# Patient Record
Sex: Male | Born: 1945 | Race: White | Hispanic: No | Marital: Single | Smoking: Former smoker
Health system: Southern US, Community
[De-identification: ages and names within clinical notes are randomized; demographics above are authoritative.]

## PROBLEM LIST (undated history)

## (undated) DIAGNOSIS — I10 Essential (primary) hypertension: Secondary | ICD-10-CM

## (undated) DIAGNOSIS — I259 Chronic ischemic heart disease, unspecified: Secondary | ICD-10-CM

## (undated) DIAGNOSIS — E1165 Type 2 diabetes mellitus with hyperglycemia: Secondary | ICD-10-CM

## (undated) DIAGNOSIS — E7800 Pure hypercholesterolemia, unspecified: Secondary | ICD-10-CM

## (undated) DIAGNOSIS — K759 Inflammatory liver disease, unspecified: Secondary | ICD-10-CM

## (undated) HISTORY — DX: Pure hypercholesterolemia, unspecified: E78.00

## (undated) HISTORY — DX: Type 2 diabetes mellitus with hyperglycemia: E11.65

## (undated) HISTORY — DX: Essential (primary) hypertension: I10

## (undated) HISTORY — DX: Chronic ischemic heart disease, unspecified: I25.9

## (undated) HISTORY — DX: Inflammatory liver disease, unspecified: K75.9

---

## 2006-07-13 ENCOUNTER — Emergency Department: Payer: Self-pay | Admitting: Emergency Medicine

## 2007-11-21 HISTORY — PX: LEG SURGERY: SHX1003

## 2008-10-02 ENCOUNTER — Inpatient Hospital Stay: Payer: Self-pay | Admitting: Orthopedic Surgery

## 2008-10-19 ENCOUNTER — Ambulatory Visit: Payer: Self-pay | Admitting: Orthopedic Surgery

## 2010-10-07 IMAGING — CR DG CHEST 1V
1 series · 2 of 2 positions shown · non-contrast
Comparison: none

REASON FOR EXAM: fall
COMMENTS:

PROCEDURE:     DXR - DXR CHEST 1 VIEWAP OR PA  - October 02, 2008 [DATE]
RESULT:     The lung fields are clear. The heart, mediastinal and osseous
structures reveal no significant abnormalities.

[Series 1: view not recorded · 0.17mm/px · 2 of 2 slices shown]
[im 1/2]
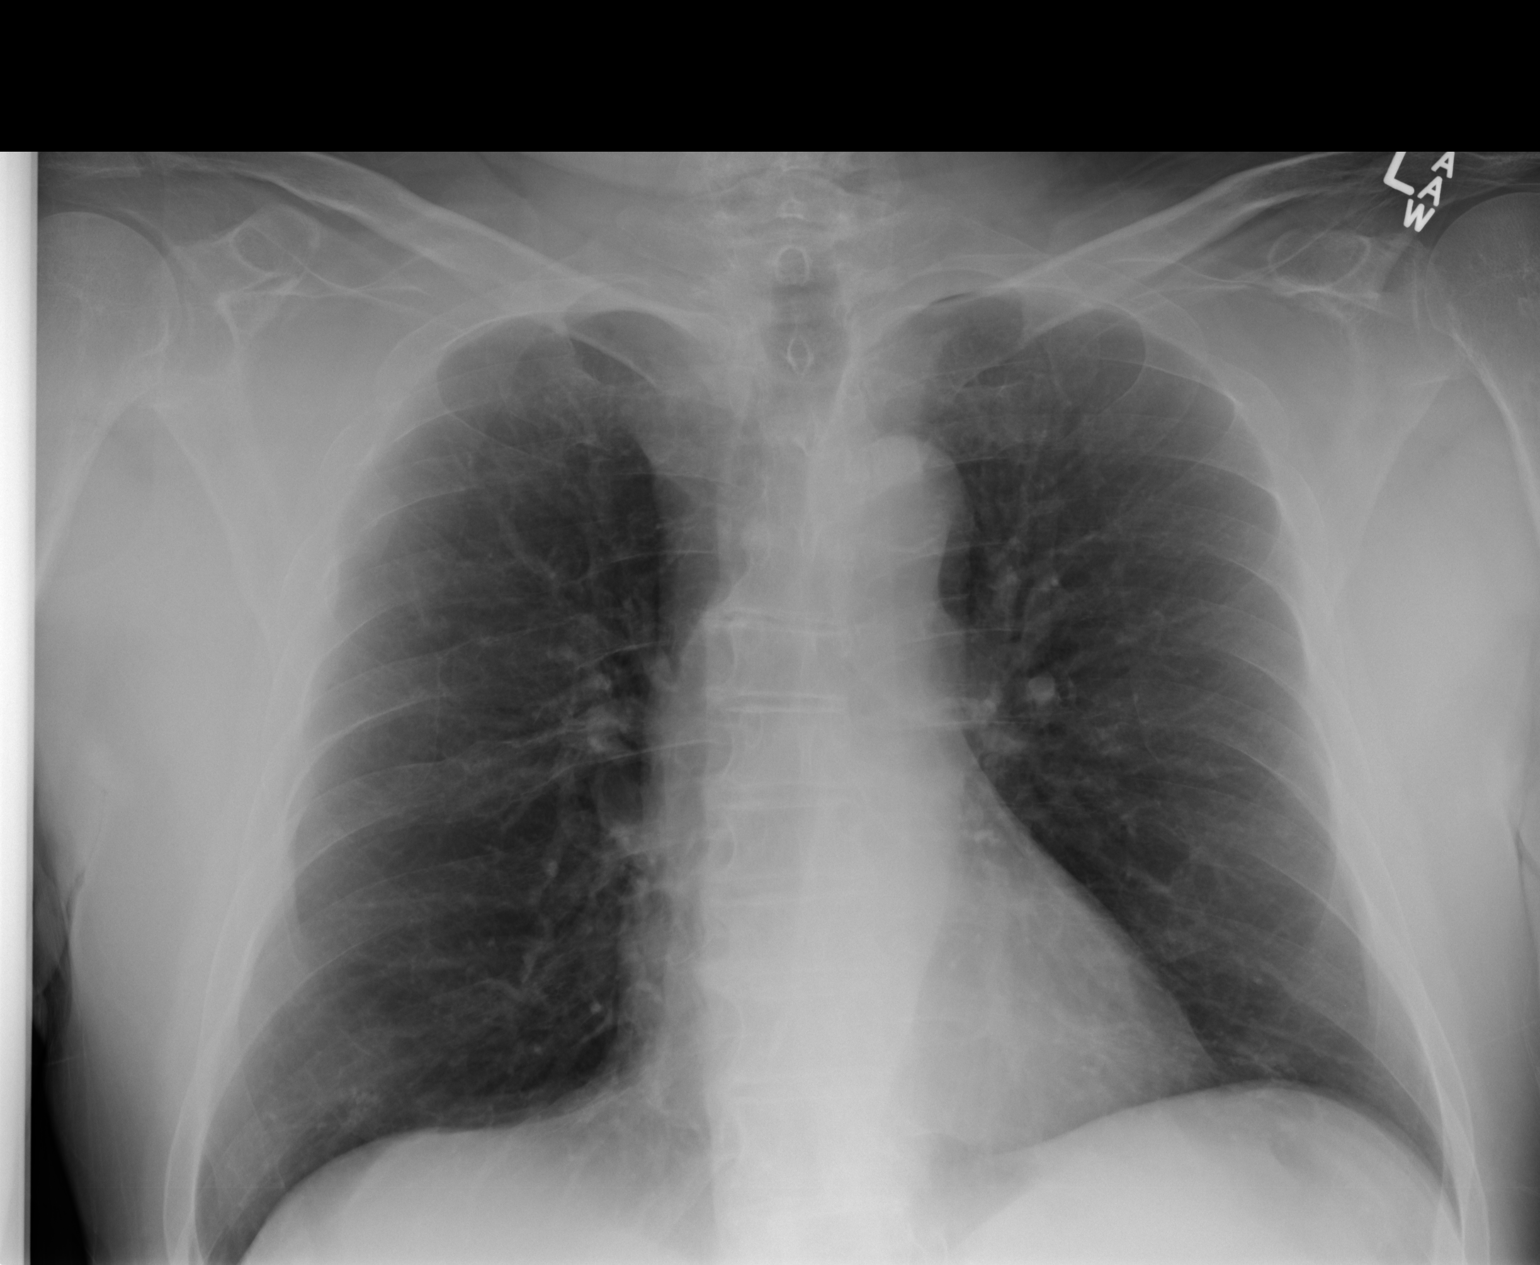
[im 2/2]
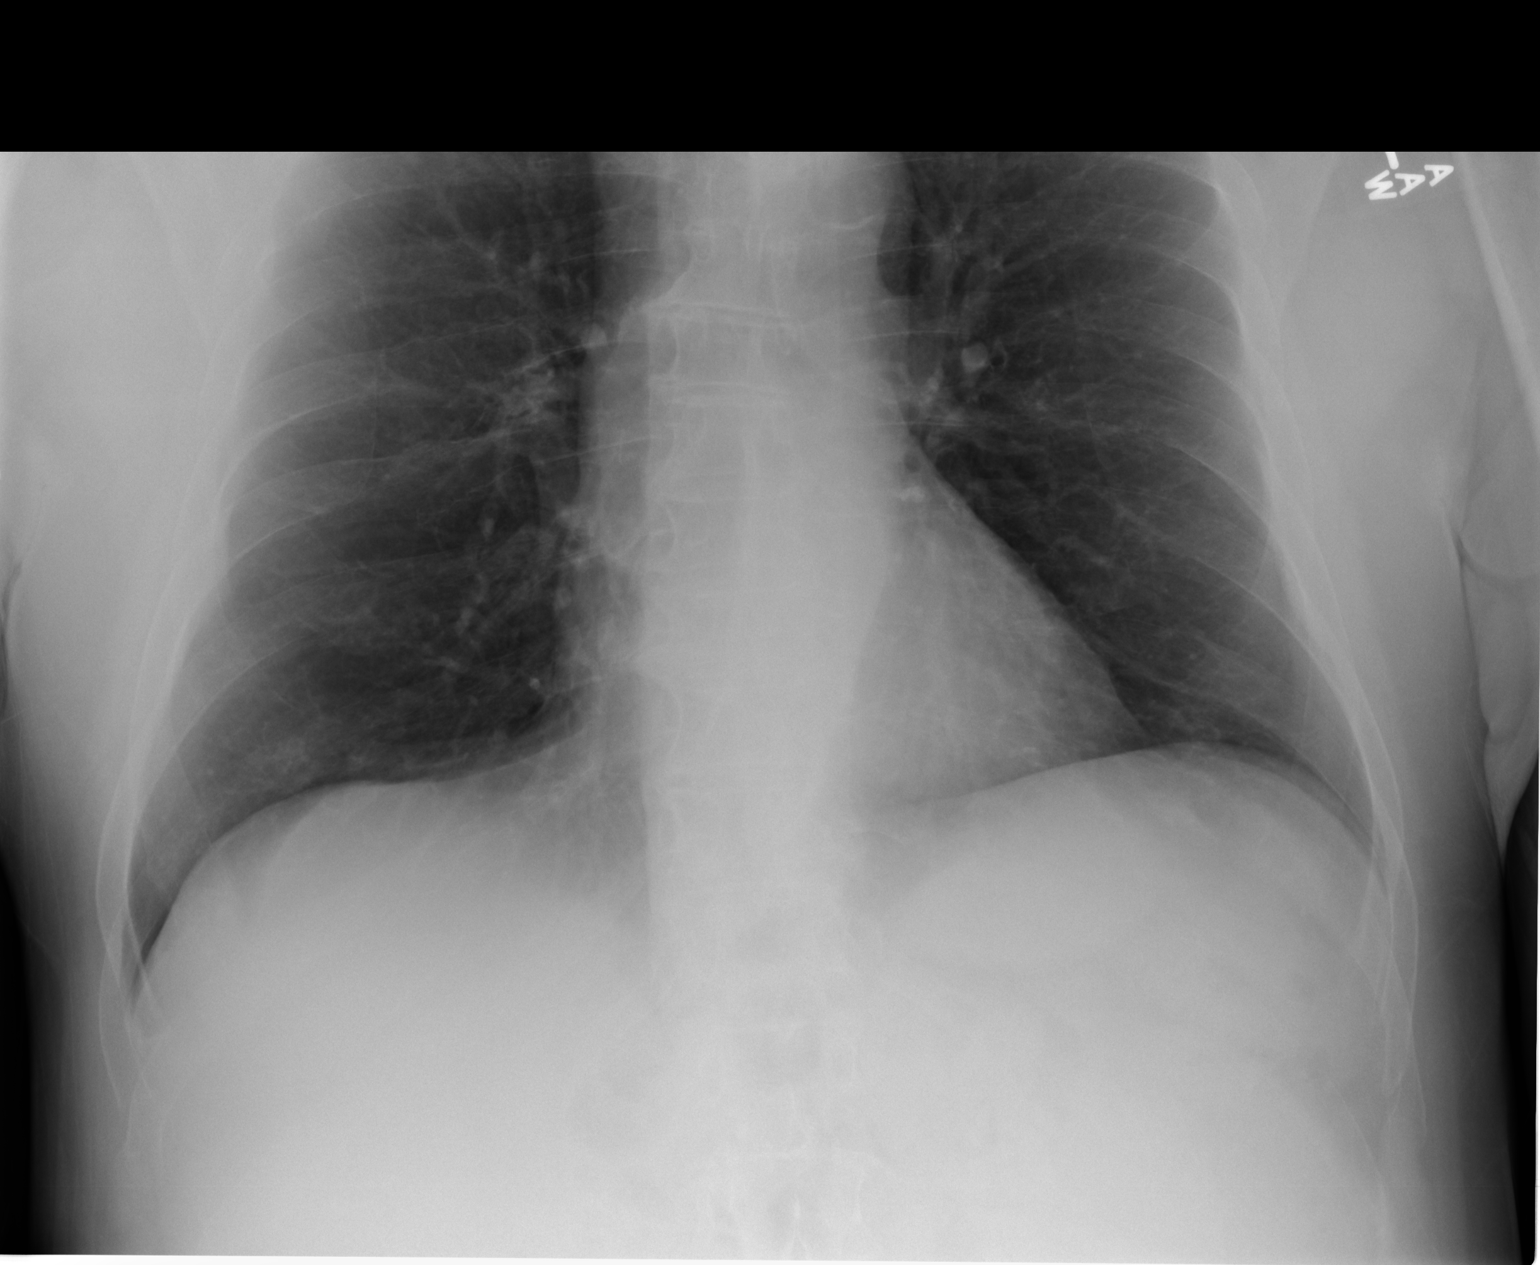

[2 of 2 positions shown; findings below may reference images not displayed]

IMPRESSION: No significant abnormalities are noted.

## 2010-10-24 IMAGING — US US EXTREM LOW VENOUS*L*
1 series · 17 of 20 positions shown · non-contrast
Comparison: none

REASON FOR EXAM: left leg swelling and tenderness  CALL report   [id number]
COMMENTS:

[Series 1: us extrem low venous*left* · 17 of 20 slices shown]
[im 1/20]
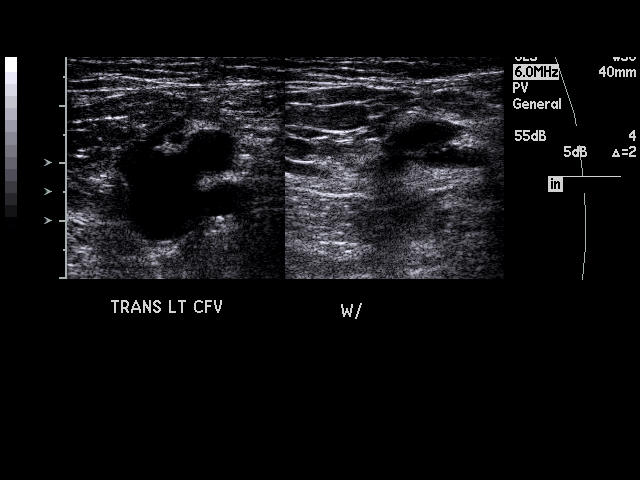
[im 2/20]
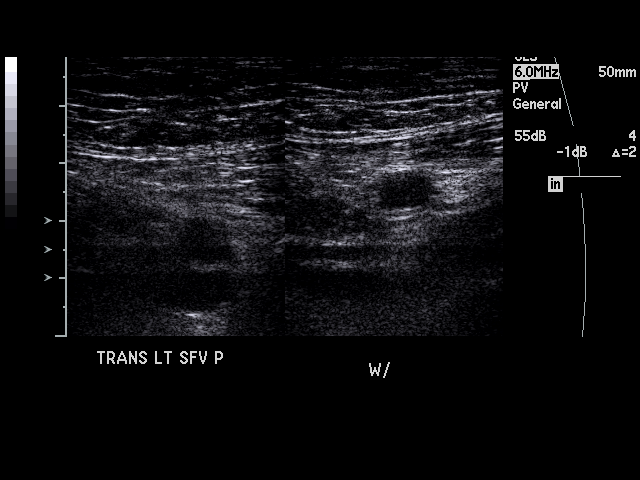
[im 3/20]
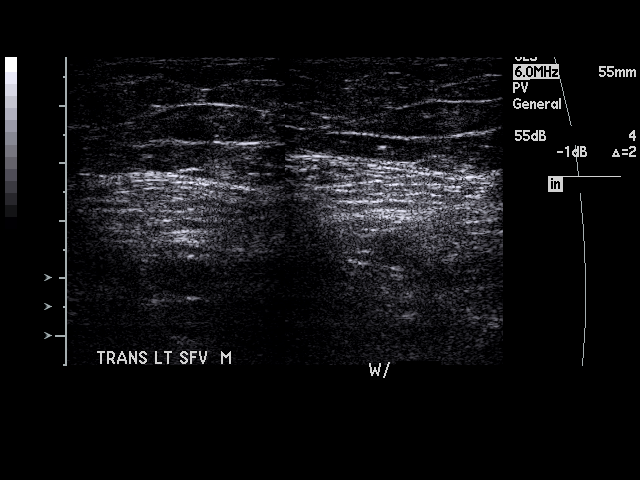
[im 5/20]
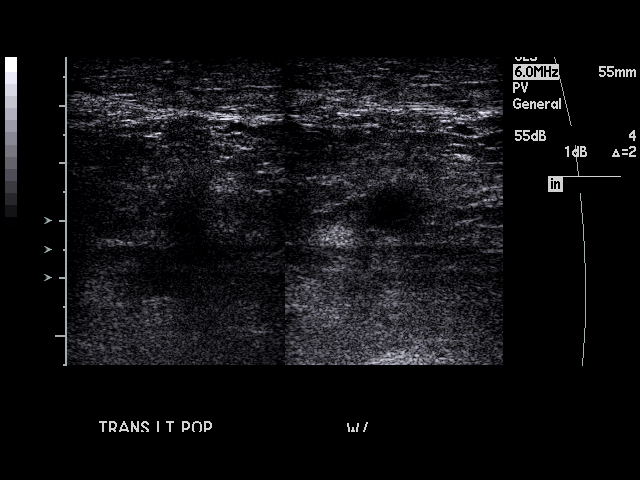
[im 6/20]
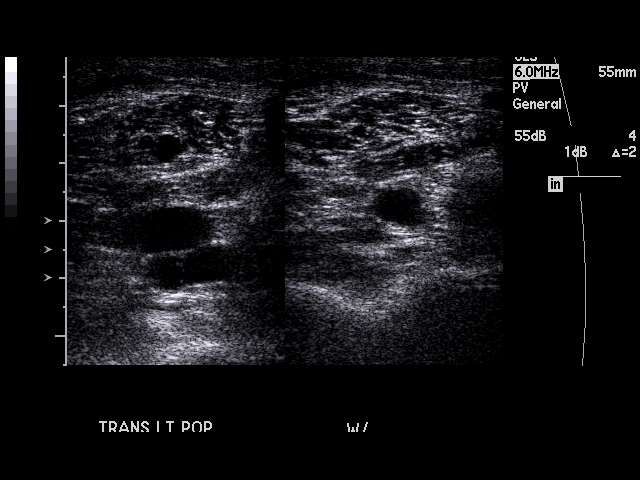
[im 7/20]
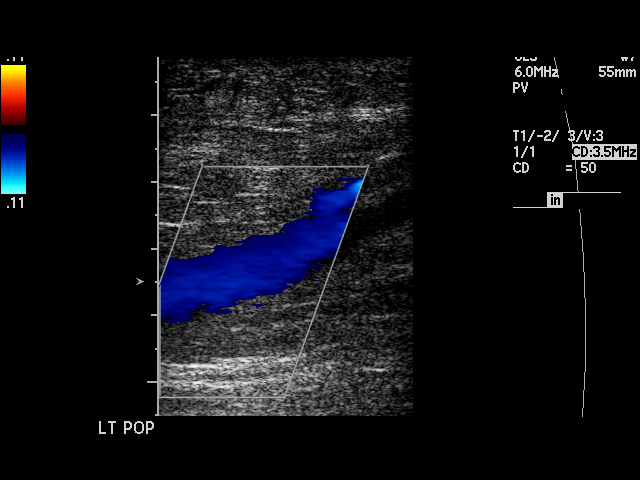
[im 8/20]
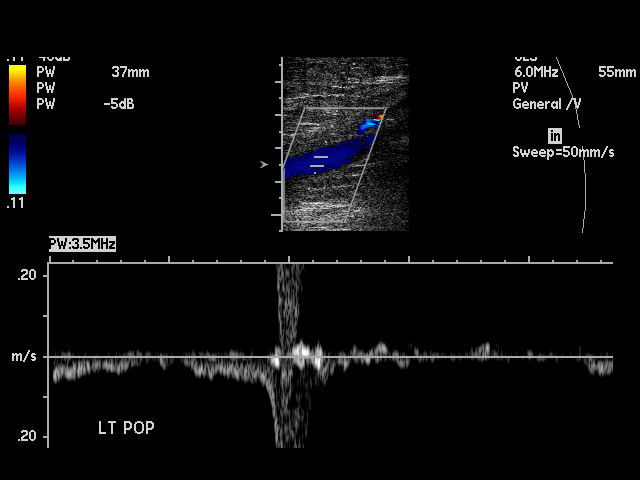
[im 9/20]
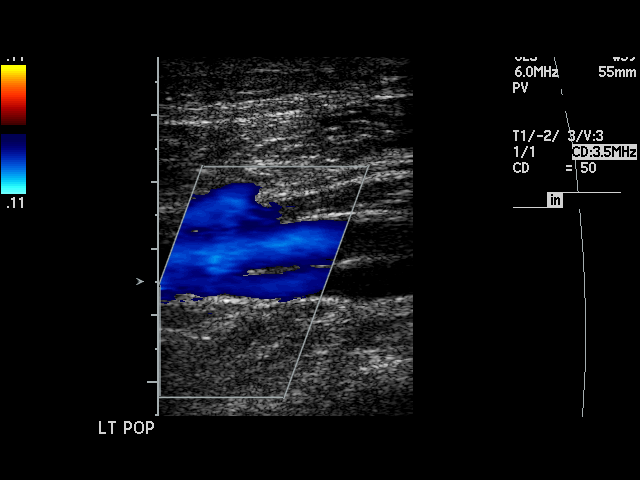
[im 11/20]
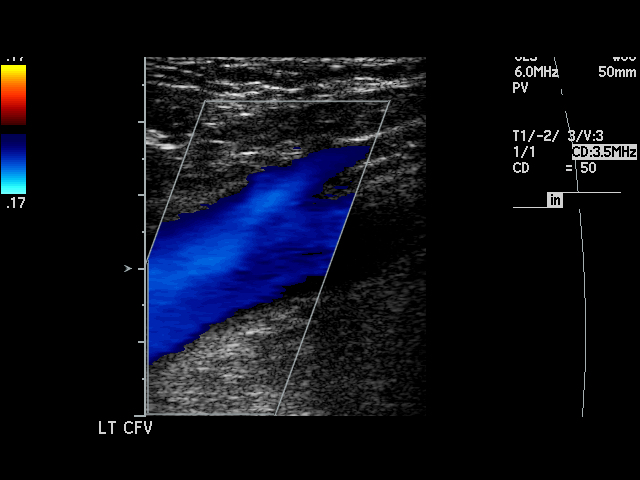
[im 12/20]
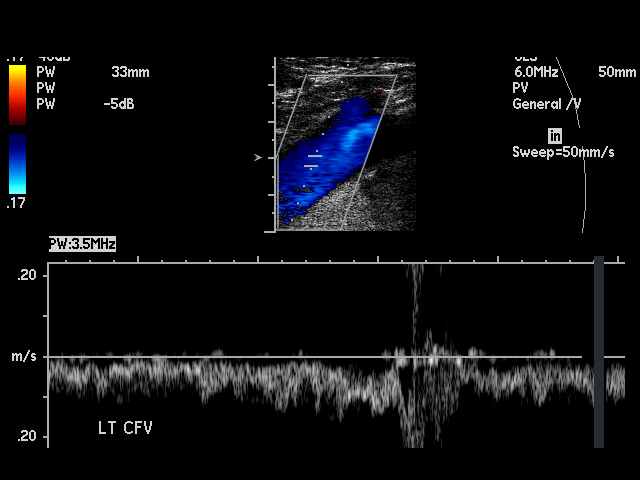
[im 13/20]
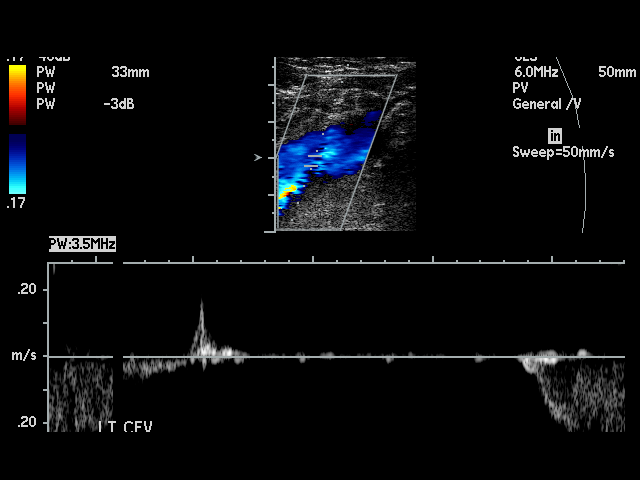
[im 14/20]
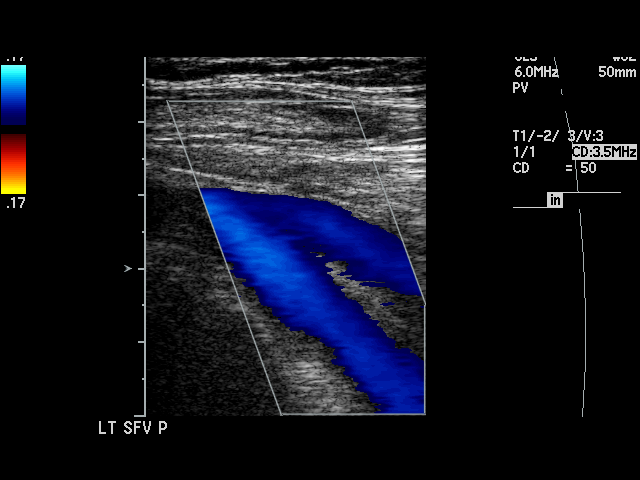
[im 15/20]
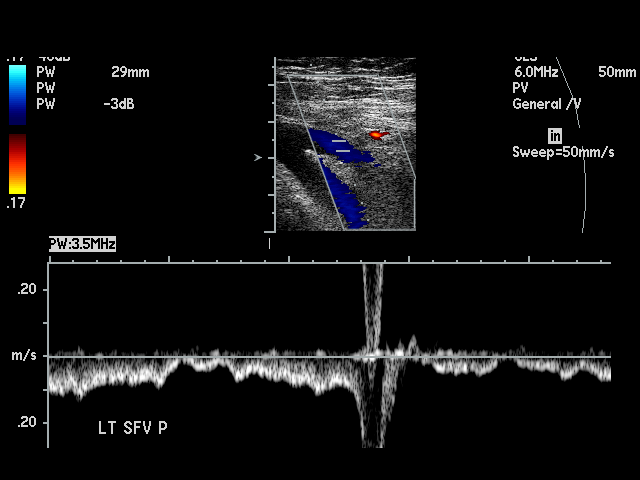
[im 16/20]
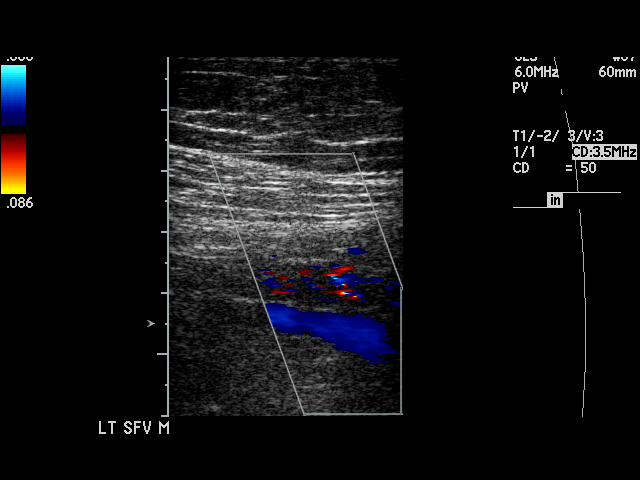
[im 18/20]
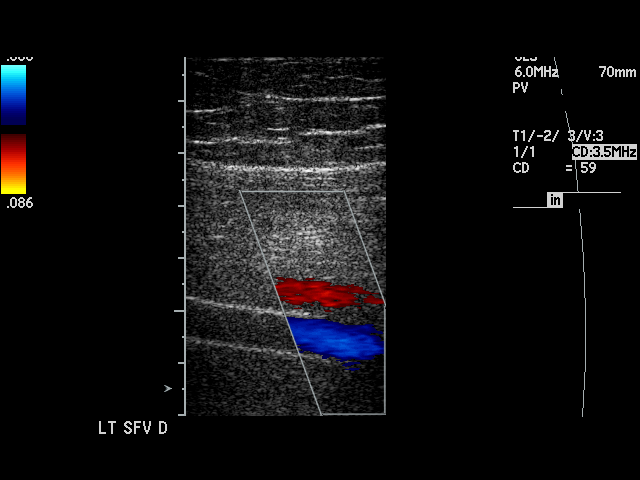
[im 19/20]
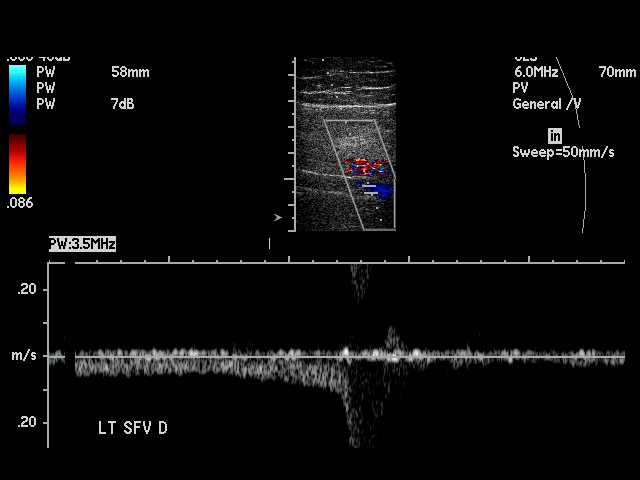
[im 20/20]
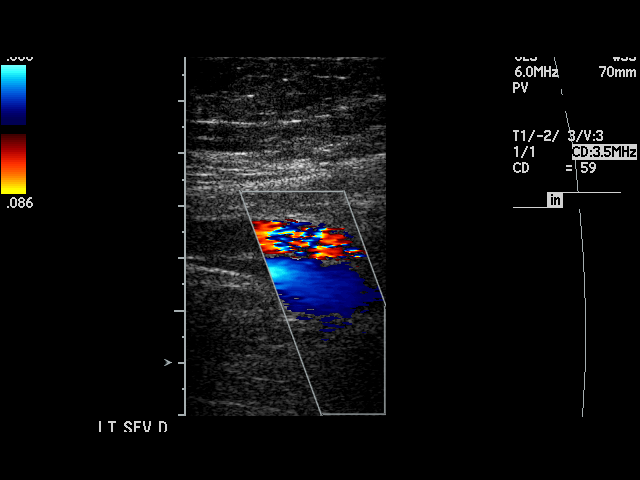

[17 of 20 positions shown; findings below may reference images not displayed]

PROCEDURE:     US  - US DOPPLER LOW EXTR LEFT  - October 19, 2008  [DATE]

RESULT:     Grayscale, duplex, color-flow, and SPECRAL waveform imaging was
performed of the deep venous structures of the LEFT lower extremity.

There is no evidence of increased echogenicity, noncompressibility, abnormal
waveform or abnormal color-flow appreciated within the interrogated deep
venous structures of the LEFT lower extremity. There is appropriate response
to Valsalva and augmentation within interrogated vessels.
IMPRESSION: 1.     No sonographic evidence of deep venous thrombus within the
interrogated vessels of the LEFT lower extremity.
2.     A preliminary fax report was relayed on 10/19/08 at [DATE] p.m. CST.

## 2014-05-13 DIAGNOSIS — I10 Essential (primary) hypertension: Secondary | ICD-10-CM

## 2014-05-13 HISTORY — DX: Essential (primary) hypertension: I10

## 2014-08-13 DIAGNOSIS — E1165 Type 2 diabetes mellitus with hyperglycemia: Secondary | ICD-10-CM

## 2014-08-13 DIAGNOSIS — IMO0002 Reserved for concepts with insufficient information to code with codable children: Secondary | ICD-10-CM

## 2014-08-13 DIAGNOSIS — I259 Chronic ischemic heart disease, unspecified: Secondary | ICD-10-CM

## 2014-08-13 HISTORY — DX: Type 2 diabetes mellitus with hyperglycemia: E11.65

## 2014-08-13 HISTORY — DX: Reserved for concepts with insufficient information to code with codable children: IMO0002

## 2014-08-13 HISTORY — DX: Chronic ischemic heart disease, unspecified: I25.9

## 2014-11-16 DIAGNOSIS — E7800 Pure hypercholesterolemia, unspecified: Secondary | ICD-10-CM | POA: Insufficient documentation

## 2014-11-16 HISTORY — DX: Pure hypercholesterolemia, unspecified: E78.00

## 2018-10-04 ENCOUNTER — Ambulatory Visit (INDEPENDENT_AMBULATORY_CARE_PROVIDER_SITE_OTHER): Payer: Medicare Other | Admitting: Urology

## 2018-10-04 ENCOUNTER — Other Ambulatory Visit
Admission: RE | Admit: 2018-10-04 | Discharge: 2018-10-04 | Disposition: A | Discharge status: Home / Self Care | Payer: Medicare Other | Source: Ambulatory Visit | Attending: Nurse Practitioner | Admitting: Nurse Practitioner

## 2018-10-04 ENCOUNTER — Encounter: Payer: Self-pay | Admitting: Urology

## 2018-10-04 VITALS — BP 147/73 | HR 78 | Ht 66.0 in | Wt 180.0 lb

## 2018-10-04 DIAGNOSIS — R350 Frequency of micturition: Secondary | ICD-10-CM | POA: Diagnosis not present

## 2018-10-04 DIAGNOSIS — R351 Nocturia: Secondary | ICD-10-CM | POA: Diagnosis not present

## 2018-10-04 DIAGNOSIS — R972 Elevated prostate specific antigen [PSA]: Secondary | ICD-10-CM | POA: Diagnosis present

## 2018-10-04 LAB — BLADDER SCAN AMB NON-IMAGING

## 2018-10-04 LAB — PSA: Prostatic Specific Antigen: 2.19 ng/mL (ref 0.00–4.00)

## 2018-10-04 NOTE — Progress Notes (Signed)
10/04/2018 2:37 PM   Rosann AuerbachGayland E Schnackenberg 05-Apr-1946 [id number]  Referring provider: Gracelyn NurseJohnston, John D, MD [address] [place], [place] [id number]  Chief Complaint  Patient presents with  . Elevated PSA    New patient    HPI: 72 year old male who presents today for further evaluation of elevated PSA.  He underwent routine annual PSA screening by his primary care physician on 09/04/2018 at which time his PSA was noted to be markedly elevated to 11.56.  This is up significantly from the previous year of 1.75.  He has no previous prior to this for review.  He does report that he has urinary urgency for the past several year.  He denies incontinence.  He has nocturia x3 but drinks diet mountain dew before going to bed.  His stream is good and his is able to empty.  He initially has some daytime frequency as well and on occasion will void and the need to void again 30 minutes later.  This happens about once a day but not consistently.  His symptoms are exacerbated with drinking caffeinated sodas.  He is a diabetic.  Not on any diuretics.  No dysuria or gross hematuria.  He believes his dad my have had prostate cancer.  He passed at age 74 from complications after a hip injury.    No weight loss or bone pain.  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     PMH: Past Medical History:  Diagnosis Date  . Benign essential hypertension 05/13/2014  . Diabetes mellitus type 2, uncontrolled (HCC) 08/13/2014  . Hepatitis   . Hypercholesterolemia 11/16/2014  . Ischemic heart disease 08/13/2014    Surgical History: Past Surgical History:  Procedure Laterality Date  . LEG SURGERY  2009    Home Medications:  Allergies as of 10/04/2018   No Known Allergies     Medication List        Accurate as of 10/04/18 11:59 PM. Always use your most recent med list.          amLODipine 10 MG tablet Commonly known as:  NORVASC Take 10 mg by mouth daily.   aspirin EC 81 MG  tablet Take by mouth.   latanoprost 0.005 % ophthalmic solution Commonly known as:  XALATAN PLACE 1 DROP INTO BOTH EYES AT BEDTIME   meloxicam 7.5 MG tablet Commonly known as:  MOBIC Take 7.5 mg by mouth daily.   metFORMIN 1000 MG tablet Commonly known as:  GLUCOPHAGE Take 1,000 mg by mouth 2 (two) times daily.   metoprolol succinate 50 MG 24 hr tablet Commonly known as:  TOPROL-XL Take 50 mg by mouth daily.   pravastatin 20 MG tablet Commonly known as:  PRAVACHOL   ramipril 10 MG capsule Commonly known as:  ALTACE Take by mouth daily.       Allergies: No Known Allergies  Family History: Family History  Problem Relation Age of Onset  . Kidney cancer Neg Hx   . Prostate cancer Neg Hx     Social History:  reports that he has quit smoking. He has never used smokeless tobacco. He reports that he does not drink alcohol or use drugs.  ROS: UROLOGY Frequent Urination?: No Hard to postpone urination?: Yes Burning/pain with urination?: No Get up at night to urinate?: Yes Leakage of urine?: Yes Urine stream starts and stops?: No Trouble starting stream?: No Do you have to strain to urinate?: No Blood in urine?: No Urinary tract infection?: Yes Sexually  transmitted disease?: No Injury to kidneys or bladder?: No Painful intercourse?: No Weak stream?: No Erection problems?: No Penile pain?: No  Gastrointestinal Nausea?: No Vomiting?: No Indigestion/heartburn?: No Diarrhea?: No Constipation?: No  Constitutional Fever: No Night sweats?: No Weight loss?: Yes Fatigue?: No  Skin Skin rash/lesions?: Yes Itching?: Yes  Eyes Blurred vision?: No Double vision?: No  Ears/Nose/Throat Sore throat?: No Sinus problems?: No  Hematologic/Lymphatic Swollen glands?: No Easy bruising?: No  Cardiovascular Leg swelling?: No Chest pain?: No  Respiratory Cough?: No Shortness of breath?: No  Endocrine Excessive thirst?: No  Musculoskeletal Back pain?:  No Joint pain?: No  Neurological Headaches?: No Dizziness?: No  Psychologic Depression?: No Anxiety?: No  Physical Exam: BP (!) 147/73   Pulse 78   Ht 5\' 6"  (1.676 m)   Wt 180 lb (81.6 kg)   BMI 29.05 kg/m   Constitutional:  Alert and oriented, No acute distress. HEENT: [place] AT, moist mucus membranes.  Trachea midline, no masses. Cardiovascular: No clubbing, cyanosis, or edema. Respiratory: Normal respiratory effort, no increased work of breathing. GI: Abdomen is soft, nontender, nondistended, no abdominal masses GU: No CVA tenderness To: Normal sphincter tone.  40 cc rubbery prostate, no nodules. Skin: No rashes, bruises or suspicious lesions. Neurologic: Grossly intact, no focal deficits, moving all 4 extremities. Psychiatric: Normal mood and affect.  Laboratory Data: Creatinine 7.0 as of 08/2018 Hemoglobin A1c 6.3 Hemoglobin 13.9  Urinalysis UA from 09/04/2018 unremarkable, reviewed in care everywhere  Pertinent Imaging: Results for orders placed or performed in visit on 10/04/18  BLADDER SCAN AMB NON-IMAGING  Result Value Ref Range   Scan Result 44ml     Assessment & Plan:    1. Elevated PSA  We reviewed the implications of an elevated PSA and the uncertainty surrounding it. In general, a man's PSA increases with age and is produced by both normal and cancerous prostate tissue. Differential for elevated PSA is BPH, prostate cancer, infection, recent intercourse/ejaculation, prostate infarction, recent urethroscopic manipulation (foley placement/cystoscopy) and prostatitis. Management of an elevated PSA can include observation or prostate biopsy and wediscussed this in detail. We discussed that indications for prostate biopsy are defined by age and race specific PSA cutoffs as well as a PSA velocity of 0.75/year.  PSA was repeated today.  If his PSA is back down to baseline, no further follow-up needed.  PSA remains elevated, will call him with further  recommendations which may include continued close surveillance versus prostate biopsy.  He understands all this and is agreeable with the plan.  - PSA; Future - BLADDER SCAN AMB NON-IMAGING  2. Urinary frequency Urinary frequency, likely primarily behavioral exacerbated by diabetes  Discussed behavioral modification  Adequate bladder emptying demonstrated today  At this point in time, he is not particularly  bothered by his symptoms thus not interested in pharmcotherapy   3. Nocturia As above   Will call with PSA results and f/u plan  Vanna Scotland, MD  [facility] [address] [place], [place] [address] [phone]

## 2018-10-07 ENCOUNTER — Telehealth: Payer: Self-pay

## 2018-10-07 NOTE — Telephone Encounter (Signed)
Called patient no answer no vmail

## 2018-10-07 NOTE — Telephone Encounter (Signed)
-----   Message from Vanna ScotlandAshley Brandon, MD sent at 10/05/2018  2:38 PM EST ----- PSA is back down to baseline.  No further evaluation needed.  You may follow up with your PCP.  This is good news.  Vanna ScotlandAshley Brandon, MD

## 2018-10-11 NOTE — Telephone Encounter (Signed)
Patient notified

## 2020-01-14 DIAGNOSIS — H2513 Age-related nuclear cataract, bilateral: Secondary | ICD-10-CM | POA: Diagnosis not present

## 2020-01-14 DIAGNOSIS — H53002 Unspecified amblyopia, left eye: Secondary | ICD-10-CM | POA: Diagnosis not present

## 2020-01-14 DIAGNOSIS — H40039 Anatomical narrow angle, unspecified eye: Secondary | ICD-10-CM | POA: Diagnosis not present

## 2020-01-14 DIAGNOSIS — E119 Type 2 diabetes mellitus without complications: Secondary | ICD-10-CM | POA: Diagnosis not present

## 2020-01-14 DIAGNOSIS — H401131 Primary open-angle glaucoma, bilateral, mild stage: Secondary | ICD-10-CM | POA: Diagnosis not present

## 2020-07-21 DEATH — deceased
